# Patient Record
Sex: Male | Born: 2000 | Race: White | Hispanic: No | Marital: Single | Smoking: Never smoker
Health system: Southern US, Community
[De-identification: ages and names within clinical notes are randomized; demographics above are authoritative.]

## PROBLEM LIST (undated history)

## (undated) DIAGNOSIS — T7840XA Allergy, unspecified, initial encounter: Secondary | ICD-10-CM

## (undated) DIAGNOSIS — J302 Other seasonal allergic rhinitis: Secondary | ICD-10-CM

## (undated) DIAGNOSIS — J45909 Unspecified asthma, uncomplicated: Secondary | ICD-10-CM

## (undated) HISTORY — DX: Allergy, unspecified, initial encounter: T78.40XA

## (undated) HISTORY — DX: Unspecified asthma, uncomplicated: J45.909

## (undated) HISTORY — DX: Other seasonal allergic rhinitis: J30.2

---
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
## 2018-05-11 DIAGNOSIS — Z23 Encounter for immunization: Secondary | ICD-10-CM | POA: Diagnosis not present

## 2018-05-14 DIAGNOSIS — J0100 Acute maxillary sinusitis, unspecified: Secondary | ICD-10-CM | POA: Diagnosis not present

## 2018-05-20 DIAGNOSIS — H6983 Other specified disorders of Eustachian tube, bilateral: Secondary | ICD-10-CM | POA: Diagnosis not present

## 2018-05-22 DIAGNOSIS — H6692 Otitis media, unspecified, left ear: Secondary | ICD-10-CM | POA: Diagnosis not present

## 2018-05-22 DIAGNOSIS — Z6823 Body mass index (BMI) 23.0-23.9, adult: Secondary | ICD-10-CM | POA: Diagnosis not present

## 2018-05-22 DIAGNOSIS — J329 Chronic sinusitis, unspecified: Secondary | ICD-10-CM | POA: Diagnosis not present

## 2018-05-22 DIAGNOSIS — M542 Cervicalgia: Secondary | ICD-10-CM | POA: Diagnosis not present

## 2018-07-31 DIAGNOSIS — K080 Exfoliation of teeth due to systemic causes: Secondary | ICD-10-CM | POA: Diagnosis not present

## 2018-08-09 DIAGNOSIS — L814 Other melanin hyperpigmentation: Secondary | ICD-10-CM | POA: Diagnosis not present

## 2018-08-09 DIAGNOSIS — D225 Melanocytic nevi of trunk: Secondary | ICD-10-CM | POA: Diagnosis not present

## 2018-08-09 DIAGNOSIS — Z872 Personal history of diseases of the skin and subcutaneous tissue: Secondary | ICD-10-CM | POA: Diagnosis not present

## 2018-08-09 DIAGNOSIS — D1801 Hemangioma of skin and subcutaneous tissue: Secondary | ICD-10-CM | POA: Diagnosis not present

## 2018-10-23 DIAGNOSIS — Z6823 Body mass index (BMI) 23.0-23.9, adult: Secondary | ICD-10-CM | POA: Diagnosis not present

## 2018-10-23 DIAGNOSIS — H6980 Other specified disorders of Eustachian tube, unspecified ear: Secondary | ICD-10-CM | POA: Diagnosis not present

## 2018-12-13 DIAGNOSIS — R300 Dysuria: Secondary | ICD-10-CM | POA: Diagnosis not present

## 2019-01-14 DIAGNOSIS — Z0000 Encounter for general adult medical examination without abnormal findings: Secondary | ICD-10-CM | POA: Diagnosis not present

## 2019-01-14 DIAGNOSIS — Z111 Encounter for screening for respiratory tuberculosis: Secondary | ICD-10-CM | POA: Diagnosis not present

## 2019-07-28 DIAGNOSIS — D225 Melanocytic nevi of trunk: Secondary | ICD-10-CM | POA: Diagnosis not present

## 2019-07-28 DIAGNOSIS — Z872 Personal history of diseases of the skin and subcutaneous tissue: Secondary | ICD-10-CM | POA: Diagnosis not present

## 2019-07-28 DIAGNOSIS — L814 Other melanin hyperpigmentation: Secondary | ICD-10-CM | POA: Diagnosis not present

## 2019-09-17 DIAGNOSIS — F419 Anxiety disorder, unspecified: Secondary | ICD-10-CM | POA: Diagnosis not present

## 2019-09-17 DIAGNOSIS — Z1331 Encounter for screening for depression: Secondary | ICD-10-CM | POA: Diagnosis not present

## 2019-10-28 DIAGNOSIS — Z111 Encounter for screening for respiratory tuberculosis: Secondary | ICD-10-CM | POA: Diagnosis not present

## 2020-02-12 DIAGNOSIS — Z0000 Encounter for general adult medical examination without abnormal findings: Secondary | ICD-10-CM | POA: Diagnosis not present

## 2020-02-19 DIAGNOSIS — J4520 Mild intermittent asthma, uncomplicated: Secondary | ICD-10-CM | POA: Diagnosis not present

## 2020-02-19 DIAGNOSIS — Z0000 Encounter for general adult medical examination without abnormal findings: Secondary | ICD-10-CM | POA: Diagnosis not present

## 2020-02-19 DIAGNOSIS — F419 Anxiety disorder, unspecified: Secondary | ICD-10-CM | POA: Diagnosis not present

## 2020-05-22 DIAGNOSIS — Z23 Encounter for immunization: Secondary | ICD-10-CM | POA: Diagnosis not present

## 2020-08-31 DIAGNOSIS — L905 Scar conditions and fibrosis of skin: Secondary | ICD-10-CM | POA: Diagnosis not present

## 2020-08-31 DIAGNOSIS — Z872 Personal history of diseases of the skin and subcutaneous tissue: Secondary | ICD-10-CM | POA: Diagnosis not present

## 2020-08-31 DIAGNOSIS — L814 Other melanin hyperpigmentation: Secondary | ICD-10-CM | POA: Diagnosis not present

## 2020-08-31 DIAGNOSIS — D225 Melanocytic nevi of trunk: Secondary | ICD-10-CM | POA: Diagnosis not present

## 2020-11-19 DIAGNOSIS — Z111 Encounter for screening for respiratory tuberculosis: Secondary | ICD-10-CM | POA: Diagnosis not present

## 2021-05-21 DIAGNOSIS — Z23 Encounter for immunization: Secondary | ICD-10-CM | POA: Diagnosis not present

## 2021-09-29 DIAGNOSIS — R7989 Other specified abnormal findings of blood chemistry: Secondary | ICD-10-CM | POA: Diagnosis not present

## 2021-09-29 DIAGNOSIS — Z0000 Encounter for general adult medical examination without abnormal findings: Secondary | ICD-10-CM | POA: Diagnosis not present

## 2021-10-03 DIAGNOSIS — J329 Chronic sinusitis, unspecified: Secondary | ICD-10-CM | POA: Diagnosis not present

## 2021-10-03 DIAGNOSIS — L814 Other melanin hyperpigmentation: Secondary | ICD-10-CM | POA: Diagnosis not present

## 2021-10-03 DIAGNOSIS — L821 Other seborrheic keratosis: Secondary | ICD-10-CM | POA: Diagnosis not present

## 2021-10-03 DIAGNOSIS — Z1339 Encounter for screening examination for other mental health and behavioral disorders: Secondary | ICD-10-CM | POA: Diagnosis not present

## 2021-10-03 DIAGNOSIS — Z1331 Encounter for screening for depression: Secondary | ICD-10-CM | POA: Diagnosis not present

## 2021-10-03 DIAGNOSIS — Z0000 Encounter for general adult medical examination without abnormal findings: Secondary | ICD-10-CM | POA: Diagnosis not present

## 2021-10-03 DIAGNOSIS — D225 Melanocytic nevi of trunk: Secondary | ICD-10-CM | POA: Diagnosis not present

## 2021-11-14 DIAGNOSIS — Z111 Encounter for screening for respiratory tuberculosis: Secondary | ICD-10-CM | POA: Diagnosis not present

## 2022-08-10 DIAGNOSIS — D225 Melanocytic nevi of trunk: Secondary | ICD-10-CM | POA: Diagnosis not present

## 2022-08-10 DIAGNOSIS — L814 Other melanin hyperpigmentation: Secondary | ICD-10-CM | POA: Diagnosis not present

## 2022-08-10 DIAGNOSIS — L821 Other seborrheic keratosis: Secondary | ICD-10-CM | POA: Diagnosis not present
